# Patient Record
Sex: Female | Born: 1970 | Race: White | Hispanic: No | Marital: Married | Smoking: Never smoker
Health system: Southern US, Community
[De-identification: ages and names within clinical notes are randomized; demographics above are authoritative.]

## PROBLEM LIST (undated history)

## (undated) DIAGNOSIS — K644 Residual hemorrhoidal skin tags: Secondary | ICD-10-CM

## (undated) HISTORY — DX: Residual hemorrhoidal skin tags: K64.4

---
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
## 1992-04-14 HISTORY — PX: TONSILLECTOMY: SUR1361

## 2010-01-22 ENCOUNTER — Telehealth (INDEPENDENT_AMBULATORY_CARE_PROVIDER_SITE_OTHER): Payer: Self-pay | Admitting: *Deleted

## 2010-01-23 ENCOUNTER — Ambulatory Visit: Payer: Self-pay | Admitting: Sports Medicine

## 2010-01-23 DIAGNOSIS — M76899 Other specified enthesopathies of unspecified lower limb, excluding foot: Secondary | ICD-10-CM

## 2010-01-23 DIAGNOSIS — M629 Disorder of muscle, unspecified: Secondary | ICD-10-CM

## 2010-02-21 ENCOUNTER — Telehealth (INDEPENDENT_AMBULATORY_CARE_PROVIDER_SITE_OTHER): Payer: Self-pay | Admitting: *Deleted

## 2010-02-28 ENCOUNTER — Ambulatory Visit: Payer: Self-pay | Admitting: Sports Medicine

## 2010-02-28 DIAGNOSIS — M25559 Pain in unspecified hip: Secondary | ICD-10-CM

## 2010-05-14 NOTE — Progress Notes (Signed)
Summary: hip pain  Phone Note Call from Patient   Caller: Patient Summary of Call: Pt states she is training for 1/2 marathon in December and full marathon in Feb. She ran last tue 01/15/10 and felt a pinching sensation in her hip, by the end of that evening she was having trouble walking due to hip pain.  She did not run for a full week and the pain was better.  She ran 2 miles today, and the pain returned.  Advised patient not to run tomorrow, and come for her appt w/ Dr. Nedra Hai at 4pm for eval.  Initial call taken by: Rochele Pages RN,  January 22, 2010 12:17 PM

## 2010-05-14 NOTE — Assessment & Plan Note (Signed)
Summary: NP,HIP U/S,RUNNER,MC   Vital Signs:  Patient profile:   40 year old female Height:      63 inches Weight:      143 pounds BMI:     25.42 Pulse rate:   59 / minute BP sitting:   109 / 69  (right arm)  Vitals Entered By: Rochele Pages RN (January 23, 2010 4:04 PM) CC: lt hip pain x1 week   CC:  lt hip pain x1 week.  History of Present Illness: Patient presents to clinic for evaluation of left hip pain.  States she is training for a 1/2 marathon in December, running 20-25 miles per week.  Ran 01/15/10 and felt a pinch sensation in her left hip, by the end of that evening she was having trouble walking due to the pain in her hip.   She has taken meloxicam and aleve for the pain- this has been helpful. She did not run for the last week and the pain had improved.  She ran again yesterday and the hip pain returned, she also reports pain in lateral aspect of the left knee, she experienced for the first time yesterday. Just commited to a full marathon as well.  Was up to 10 miles on weekend and 5 miles x 2 days mid week before stopping running Icing since yesterday. No other prior problems with her hip. Has had prev PF and shin splints on L side as well. Has been to [employer] and gotten stability shoes for overpronation.  Allergies (verified): 1)  ! Sulfa  Physical Exam  General:  Well-developed,well-nourished,in no acute distress; alert,appropriate and cooperative throughout examination Msk:  Hips: FROM, + mild ttp over L GT.  Neg log roll and FADIR/FABER.  Nl strength with hip abd/IR/ER  Knees: FROM, mild ttp over LFC on L.  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 2)  Meloxicam 15 mg daily x 1 week, then as needed. 3)  Stretch two times a day 4)  1 week off from running, then gradually increase back starting at 1.5 or 2 miles, and increase 10-20% mileage per week until back to normal running distances. 5)  Do hip strength exercises at least 3 times per week. 6)  Cross train (bike or swim) this week and continue this on off days in the future. 7)  You have IT band syndrome and trochanteric bursitis.

## 2010-05-14 NOTE — Assessment & Plan Note (Signed)
Summary: F/U PER AMY,MC   CC:  hip pain.  History of Present Illness: 40yo female to office for f/u hip pain.  Started about 58-month ago. Has not been doing much running - doing about once a week at this point, has pain 1/2 mile in & really painful at 2-mile mark. Did 5k over weekend, ran 29-min.  Took aleve before race & did ok. 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 Occasional night time pain. Is doing stretches some, but not regularly. Is icing after runs & days following runs. Was running 20-25 miles/wk prior to injury. Leaving on cruise tomorrow to the [place].  Allergies: 1)  ! Sulfa  Review of Systems      See HPI  Physical Exam  General:  Well-developed,well-nourished,in no acute distress; alert,appropriate and cooperative throughout examination Msk:  HIPS: - L hip:  Full ROM, neg log roll.  TTP over greater troch, no groin tenderness.  Neg FABER/FADIR.  Slight weakness of hip flexion & adduction compared to right, othewise normal strength - R hip: full ROM, neg log roll.  No significant TTP over greater troch.  Neg FABER/FADIR.  Normal strength.  KNEES: full ROM b/l without pain.  Mild TTP b/l over insertion of ITB.  Midly positive Ober on left.  No effusion or swelling.  No ligamentous instability.  Neg mcmurray.  FEET: good longitudinal & transverse arches.  No signicant callous formation.  GAIT:  no leg length difference.  Slight external rotation of both feet with walking & running.  With running has mild outswing of R foot, but also has some mild knee wobble.  Neurovascularly intact distally   Impression & Recommendations:  Problem # 1:  HIP PAIN, LEFT (ICD-719.45)  - Secondary to greater troch bursititis.  Noted to have slight weakness of left hip compared to right.  Also with knee wobble with running.  Suspect running mechanics causing pain. - Steroid injection completed today into L greater  troch PROCEDURE NOTE Consent obtained and verified. Area cleansed with alcohol. Topical analgesic spray: Ethyl chloride. Joint: Left greater troch bursa Approached in typical fashion with: lateral approach Completed without difficulty Meds: 4cc Lidocaine 1% + 1cc Kenalog 40mg /cc (total vol= 5cc) Needle: 25g 1.5in Aftercare instructions and Red flags advised. Tolerated procedure well without difficulty. - Should rest for next 2-3 days.  May apply ice as needed for any discomfort/pain - Ok to start cross-training in 2-3 days. - Educated on hip exercises - should start these next week & do them daily.  Focus on lateral step-up, lateral cross-over step-ups, & lateral cross-over step-downs b/c has decent overall hip strength - Cont. meloxicam as prescribed - Ok to continue running as long as not limping - f/u 4-6 weeks if no improvement, otherwise as needed   Orders: Joint Aspirate / Injection, Intermediate (20605) Kenalog 10 mg inj (U9811)  Problem # 2:  TROCHANTERIC BURSITIS (ICD-726.5)  - Left GT bursitis - injected as stated above - plan as stated above  Orders: Joint Aspirate / Injection, Intermediate (91478) Kenalog 10 mg inj (J3301)  Problem # 3:  ILIOTIBIAL BAND SYNDROME, LEFT KNEE (ICD-728.89)  - Cont. ITB stretches - Cont. meloxicam - Ok to continue running as long as not limping - should stretch regularly during runs if ITB feeling tight - f/u 4-6 weeks if no improvement, otherwise prn  Orders: Joint Aspirate / Injection, Intermediate (29562) Kenalog 10 mg inj (Z3086)  Patient  Instructions: 1)  You have greater trochanteric bursitis & mild IT-band syndrome. 2)  We injected your left hip with a steroid today - this should help.  Rest the area for about 2-3 days, then start simple range of motion exercises. 3)  You may have some increased pain or stiffness in next 2-3 days, if that is the case you can apply some ice to area. 4)  Start doing hip exercises in about  1-week. 5)  Follow exercises on handout we gave you last time: 6)  - Lateral step-ups 7)  - Cross over step-ups 8)  - Cross over step-downs 9)  - Hip adduction & abduction 10)  - Hip flexion 11)  - Hip rotations 12)  - Hip extensions 13)  - all with 3-sets of 30 without weight 14)  - Do exercises at least once a day, but try twice a day.   15)  Continue to do IT band stretches as shown. 16)  Start cross training with stationary bike & elliptical. 17)  Ok to continue running as long as not limping. 18)  Follow-up 4-6 if persists, otherwise as needed.   Orders Added: 1)  Est. Patient Level III [16109] 2)  Joint Aspirate / Injection, Intermediate [20605] 3)  Kenalog 10 mg inj [J3301]

## 2010-05-14 NOTE — Progress Notes (Signed)
----   Converted from flag ---- ---- 02/21/2010 3:49 PM, Marily Memos wrote: Pt has questions regarding her appt coming up 12/1.  call back # [phone] ------------------------------  Returned patient's call.  She states she is still having significant hip pain with running.  States the pain starts about 1/2 mile into her runs, so she has not been running in the past few weeks.  She has iced her hip when it is painful, used anti inflammatories, and done stretches.  States she has not done her strengthening exercises as often as she should.  Advised the hip strengthening exercises are very important in helping to eventually relieve her pain. Pt interested in getting hip injection.  Advised she will need to come in for f/u appt to discuss that.  Scheduled pt for f/u 02/28/10.

## 2011-01-02 ENCOUNTER — Ambulatory Visit (INDEPENDENT_AMBULATORY_CARE_PROVIDER_SITE_OTHER): Payer: BC Managed Care – PPO | Admitting: Sports Medicine

## 2011-01-02 ENCOUNTER — Ambulatory Visit: Payer: Self-pay | Admitting: Sports Medicine

## 2011-01-02 VITALS — BP 100/60 | Ht 63.0 in | Wt 142.0 lb

## 2011-01-02 DIAGNOSIS — M25559 Pain in unspecified hip: Secondary | ICD-10-CM

## 2011-01-02 DIAGNOSIS — M76899 Other specified enthesopathies of unspecified lower limb, excluding foot: Secondary | ICD-10-CM

## 2011-01-02 DIAGNOSIS — M629 Disorder of muscle, unspecified: Secondary | ICD-10-CM

## 2011-01-02 MED ORDER — NITROGLYCERIN 0.2 MG/HR TD PT24: MEDICATED_PATCH | TRANSDERMAL | Status: DC

## 2011-01-02 NOTE — Assessment & Plan Note (Signed)
No sign of bursitis on today's ultrasound scan

## 2011-01-02 NOTE — Assessment & Plan Note (Addendum)
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

## 2011-01-02 NOTE — Assessment & Plan Note (Signed)
Iliotibial band continues to be affected but primarily based on its attachment to the tensor fascia lata

## 2011-01-02 NOTE — Progress Notes (Signed)
  Subjective:    Patient ID: Jacqueline Durham, female    DOB: 1971/04/02, 40 y.o.   MRN: [id number]  HPI  Pt presents to clinic for f/u of left hip pain which is now a little higher than greater trochanter.  This is painful with lying on that side, and occasionally with sitting.  Also has foot pain which she saw [facility]ropractor for- and he dx with PF.   She was first seen with this pain one year ago. She has been able to run off and on since that time but the pain never totally resolved. She had some active release therapy by Dr. Vear Clock and that did help but never resolved the pain.  She took the summer off and the pain was less well she was not running  Now as she has started back as she increased her mileage from 3 miles to 4 miles to 5 miles on her daily runs the left hip pain has returned at a slightly different position. She also developed some plantar fascial symptoms which are now pretty mild and did respond to some of the active release therapy   Review of Systems     Objective:   Physical Exam  Feet neutral in appearance Rear foot straight bilat Moderate long arch bilat No breakdown in transverse arch bilat  Leg lengths equal FABER tight on lt FABER less tight on rt Slight anterior rotation of lt pelvis Straight leg raise good bilat, slightly tight on lt Good abduction strength on lt Good rotation strength on lt  Running gait- turns right foot out Left side of pelvis is tilted forward farther than rt  Musculoskeletal ultrasound There is a spur about 2 cm below the superior insertion of the tensor fascia lata muscle on the left There is hypoechoic change in the tendon at this level There is also some increase in vascularity At the distal insertion into the superior area of the greater trochanter there is also spurring but with less hypoechoic change in the tendon  The greater trochanteric bursa does not appear swollen today         Assessment & Plan:

## 2011-01-02 NOTE — Patient Instructions (Signed)
Start using nitroglycerin patches.   Ok to run, but keep pain at 3 out of 10.  Do not run to a level that you are limping.   Ok to cross train on elliptical.   Please return for follow up in 6 weeks.

## 2011-02-20 ENCOUNTER — Encounter: Payer: Self-pay | Admitting: Sports Medicine

## 2011-02-20 ENCOUNTER — Ambulatory Visit (INDEPENDENT_AMBULATORY_CARE_PROVIDER_SITE_OTHER): Payer: BC Managed Care – PPO | Admitting: Sports Medicine

## 2011-02-20 VITALS — BP 107/71 | HR 60

## 2011-02-20 DIAGNOSIS — M76899 Other specified enthesopathies of unspecified lower limb, excluding foot: Secondary | ICD-10-CM

## 2011-02-20 NOTE — Progress Notes (Signed)
  Subjective:    Patient ID: Jacqueline Durham, female    DOB: [birth year], 40 y.o.   MRN: [id number]  HPI  Jacqueline Durham is coming today to F/U on her L hip trochanteric bursitis. She has been on the nitro patch for 6 weeks, she has been doing strengthening and stretching exercise on her own. She also worked with a Psychologist, educational for IT band. She is 80% better compare with last summer , she was able to run 4 mi two days ago without much discomfort.  Patient Active Problem List  Diagnoses  . HIP PAIN, LEFT  . TROCHANTERIC BURSITIS  . ILIOTIBIAL BAND SYNDROME, LEFT KNEE   Current Outpatient Prescriptions on File Prior to Visit  Medication Sig Dispense Refill  . meloxicam (MOBIC) 7.5 MG tablet Take 7.5 mg by mouth daily.        . nitroGLYCERIN (NITRODUR - DOSED IN MG/24 HR) 0.2 mg/hr Apply 1/4 patch to affected area daily.  Change patch every 24 hours.  30 patch  1   Allergies  Allergen Reactions  . Sulfonamide Derivatives        Review of Systems  Constitutional: Negative for fever, chills, diaphoresis and fatigue.  Musculoskeletal: Negative for back pain, joint swelling, arthralgias and gait problem.  Neurological: Negative for weakness and numbness.       Objective:   Physical Exam  Constitutional: He is oriented to person, place, and time. He appears well-developed and well-nourished.       BP 107/71  Pulse 60   Pulmonary/Chest: Effort normal.  Musculoskeletal:       Left hip with FROM . Mild TTP on the trochanteric bursa. No limping. Sensation intact distally  No pain with resisted abduction   Neurological: He is alert and oriented to person, place, and time.  Skin: Skin is warm. No rash noted. No erythema. No pallor.  Psychiatric: He has a normal mood and affect.          Assessment & Plan:   1. TROCHANTERIC BURSITIS   Continue nitro patch.Ok to increase her dose to 1/2 patch gradually Continue IT band stretches and strengthening exercise. Gradually increase running  distance. F/U in 6 weeks. Ice massage after running

## 2011-02-20 NOTE — Patient Instructions (Signed)
Continue nitro patch.Ok to increase her dose to 1/2 patch gradually Continue IT band stretches and strengthening exercise. Gradually increase running distance. F/U in 6 weeks. Ice massage after running

## 2011-04-01 ENCOUNTER — Ambulatory Visit (INDEPENDENT_AMBULATORY_CARE_PROVIDER_SITE_OTHER): Payer: BC Managed Care – PPO | Admitting: Sports Medicine

## 2011-04-01 ENCOUNTER — Encounter: Payer: Self-pay | Admitting: Sports Medicine

## 2011-04-01 VITALS — BP 112/64 | HR 67

## 2011-04-01 DIAGNOSIS — M629 Disorder of muscle, unspecified: Secondary | ICD-10-CM

## 2011-04-01 DIAGNOSIS — M76899 Other specified enthesopathies of unspecified lower limb, excluding foot: Secondary | ICD-10-CM

## 2011-04-01 DIAGNOSIS — M25559 Pain in unspecified hip: Secondary | ICD-10-CM

## 2011-04-01 NOTE — Assessment & Plan Note (Signed)
Continue with stretches and exercises as prescribed

## 2011-04-01 NOTE — Assessment & Plan Note (Signed)
Continue using the nitroglycerin as she has clearly had some improvement since starting this

## 2011-04-01 NOTE — Progress Notes (Signed)
  Subjective:    Patient ID: Jacqueline Durham, female    DOB: 09/02/1970, 40 y.o.   MRN: [id number]  HPI  Pt presents to clinic for f/u of lt hip pain which she states is about the same. She has increased to 1/2 patch of nitroglycerin without problems. Had been running 4 miles- 3 times per week for 1 month. Last week tried to increase to 5 miles had hip pain during this run, then stood in line for 3 hours that evening, had increased hip pain for 2 days after this.   This was the second episode of increased hip pain since last visit that lasted for 2 days after running.  Has not run in 1 week due to hip pain.   Taking meloxicam on days that she runs, and aleve if she still has pain.  Had gotten up to 15 miles of running during 1 week before pain worsened. 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   Review of Systems     Objective:   Physical Exam  NAD  30 deg IR, 45 deg ER bilat hips Good hip flexion strength bilat Hip abduction strength good bilat Rotation strength good Hip rotation good on lt  MSK ultrasound There is a small avulsion spur at the insertion of the tensor fascial to the iliac crest Today there is some hypoechoic change around this tendon  At the greater trochanter there is a small spur and some calcification in the tendon sheath of what is probably the piriformis No hypoechoic change seen No abnormal Doppler activity      Assessment & Plan:

## 2011-04-01 NOTE — Patient Instructions (Signed)
I will recheck you in about 3 mos unless you run into greater probs  Keep at 1/2 NTG patch unless probs  Keep up hip exercises at least 3 x per wk  Stretch daily  Running - keep level at 4 miles until 1 wk pain free No increase > 1/2 mile at a time  Have a good holidy

## 2011-04-01 NOTE — Assessment & Plan Note (Signed)
Bursa shows calcification but no true inflammation today

## 2011-04-21 ENCOUNTER — Telehealth: Payer: Self-pay | Admitting: *Deleted

## 2011-04-21 NOTE — Telephone Encounter (Signed)
Pt called stating her hip still hurts when she stands up, works as a Runner, broadcasting/film/video.  She is scheduled to do the [employer] marathon this Sunday, she is planning to walk the marathon until she has pain and then stop.  She is concerned that she will do more damage if she attempts the marathon.  Also she wants to know if Dr. Darrick Penna would do the needling procedure he discussed with her at the last visit to break up her bone spurs.  She is still using NTG patches daily.  Advised will ask Dr. Darrick Penna and call her back.

## 2011-04-22 NOTE — Telephone Encounter (Signed)
Per Dr. Darrick Penna- advised pt he does not think she will do any permanent damage to her hip by walking the marathon, but cannot garuantee that she will not have pain.    Also he advised that he would do the needling procedure after the marathon.  Pt scheduled an appt for this.

## 2011-05-12 ENCOUNTER — Ambulatory Visit (INDEPENDENT_AMBULATORY_CARE_PROVIDER_SITE_OTHER): Payer: BC Managed Care – PPO | Admitting: Sports Medicine

## 2011-05-12 VITALS — BP 116/72

## 2011-05-12 DIAGNOSIS — M25559 Pain in unspecified hip: Secondary | ICD-10-CM

## 2011-05-12 NOTE — Assessment & Plan Note (Addendum)
Her pain is improving.  Since she has made it through the marathon and is doing better she can do a period of relative rest to allow healing to occur at the area where she had the small avulsion.  She will continue with the NTG since this is helping.  We will see her back in 6 weeks.

## 2011-05-12 NOTE — Progress Notes (Signed)
  Subjective:    Patient ID: Jacqueline Durham, female    DOB: Feb 25, 1971, 41 y.o.   MRN: [id number]  HPI Jacqueline Durham is here to follow up on left hip pain.  She had two areas of pain, one at the greater trochanter and one at the iliac crest.  She has been using NTG.  Her pain at the greater trochanter is resolved.  She was able to complete the [facility] without difficulty.  Her pain was aggravated by prolonged standing but that is improved.  It is also aggravated by lateral rotation of the trunk to the right or extreme flexion of the hip.  She is doing well on her NTG.  She used KT taping during the run.  She did a 7 hour walk/run.   Review of Systems     Objective:   Physical Exam  Hip: Pain is reproduced with palpation of the anterior iliac crest with the hip internally rotated. Normal range of motion Normal strength of the adductors, abductors, and flexors No tenderness of the ITB No tenderness of the greater trochanter        Assessment & Plan:

## 2011-05-12 NOTE — Patient Instructions (Addendum)
1. Keep doing your hip exercises so that you can keep up your strength.  2. You need 6 weeks of relative rest.  Keep your running mileage low and stick to other low impact activities.  3. Continue using your nitroglycerin patches.  4. Follow up with [place] in 6 weeks.

## 2011-05-14 ENCOUNTER — Other Ambulatory Visit: Payer: Self-pay | Admitting: *Deleted

## 2011-05-14 MED ORDER — NITROGLYCERIN 0.2 MG/HR TD PT24: MEDICATED_PATCH | TRANSDERMAL | Status: AC

## 2011-05-28 ENCOUNTER — Ambulatory Visit (INDEPENDENT_AMBULATORY_CARE_PROVIDER_SITE_OTHER): Payer: BC Managed Care – PPO | Admitting: Surgery

## 2011-05-28 ENCOUNTER — Encounter (INDEPENDENT_AMBULATORY_CARE_PROVIDER_SITE_OTHER): Payer: Self-pay | Admitting: Surgery

## 2011-05-28 VITALS — BP 104/68 | HR 66 | Temp 97.6°F | Resp 16 | Ht 63.0 in | Wt 147.2 lb

## 2011-05-28 DIAGNOSIS — K644 Residual hemorrhoidal skin tags: Secondary | ICD-10-CM | POA: Insufficient documentation

## 2011-05-28 HISTORY — DX: Residual hemorrhoidal skin tags: K64.4

## 2011-05-28 MED ORDER — HYDROCORTISONE ACE-PRAMOXINE 2.5-1 % RE CREA: TOPICAL_CREAM | Freq: Four times a day (QID) | RECTAL | Status: AC

## 2011-05-28 NOTE — Progress Notes (Signed)
Subjective:     Patient ID: Jacqueline Durham, female   DOB: 04-16-1970, 41 y.o.   MRN: [id number]  HPI  Jacqueline Durham  1971-03-16 [id number]  Patient Care Team: Lovenia Kim as PCP - General (Physician Assistant)  This patient is a 41 y.o.female who presents today for surgical evaluation at the request of Mr. Tommi Emery.   Reason for visit: Annoying external hemorrhoids.  The patient is a healthy female who has struggled with hemorrhoidal problems. She has had irritable bowel diagnosed in the past. She had a normal colonoscopy in the distant past in [place]. It was stress related. She is switched her job and that is much better. She takes FiberCon 2 tablets a day. She has one soft bowel movement a day.  She still has an external anal skin tags/hemorrhoids. They are often irritating. It is hard to keep clean. No major bleeding or prolapse that she can recall. She mentioned this to her primary care . He recommended considering if something more aggressive needs to be done with surgery.  Otherwise healthy and active. No prior anorectal treatments. Had an appendectomy in the distant past  Patient Active Problem List  Diagnoses  . HIP PAIN, LEFT  . TROCHANTERIC BURSITIS  . ILIOTIBIAL BAND SYNDROME, LEFT KNEE  . External hemorrhoids with itching    Past Medical History  Diagnosis Date  . Hemorrhoids   . External hemorrhoids with itching 05/28/2011    Past Surgical History  Procedure Date  . Appendectomy 1984  . Tonsillectomy 1994    History   Social History  . Marital Status: Married    Spouse Name: N/A    Number of Children: N/A  . Years of Education: N/A   Occupational History  . Not on file.   Social History Main Topics  . Smoking status: Never Smoker   . Smokeless tobacco: Never Used  . Alcohol Use: Yes     socially   . Drug Use: No  . Sexually Active: Not on file   Other Topics Concern  . Not on file   Social History Narrative  . No narrative on file     Family History  Problem Relation Age of Onset  . Emphysema Father   . Cancer Maternal Grandmother     patient unsure of type    Current outpatient prescriptions:Ascorbic Acid (VITAMIN C PO), Take by mouth daily., Disp: , Rfl: ;  desvenlafaxine (PRISTIQ) 50 MG 24 hr tablet, Take 50 mg by mouth daily. Patient taking 1/2 dose. Per history form dated 05/28/11., Disp: , Rfl: ;  Omega-3 Fatty Acids (FISH OIL) 500 MG CAPS, Take by mouth daily., Disp: , Rfl: ;  polycarbophil (FIBERCON) 625 MG tablet, Take 625 mg by mouth 2 (two) times daily., Disp: , Rfl:  VITAMIN E PO, Take by mouth daily., Disp: , Rfl: ;  meloxicam (MOBIC) 7.5 MG tablet, Take 7.5 mg by mouth daily.  , Disp: , Rfl: ;  nitroGLYCERIN (NITRODUR - DOSED IN MG/24 HR) 0.2 mg/hr, Apply 1/2 patch to affected area daily.  Change patch every 24 hours., Disp: 30 patch, Rfl: 1  Allergies  Allergen Reactions  . Sulfonamide Derivatives Rash    All over the body.    BP 104/68  Pulse 66  Temp(Src) 97.6 F (36.4 C) (Temporal)  Resp 16  Ht 5\' 3"  (1.6 m)  Wt 147 lb 3.2 oz (66.769 kg)  BMI 26.08 kg/m2     Review of Systems  Constitutional: Negative for fever, chills, diaphoresis, appetite  change and fatigue.  HENT: Negative for ear pain, sore throat, trouble swallowing, neck pain and ear discharge.   Eyes: Negative for photophobia, discharge and visual disturbance.  Respiratory: Negative for cough, choking, chest tightness and shortness of breath.   Cardiovascular: Negative for chest pain and palpitations.  Gastrointestinal: Negative for nausea, vomiting, abdominal pain, diarrhea, constipation, anal bleeding and rectal pain.       No personal nor family history of GI/colon cancer, inflammatory bowel disease, irritable bowel syndrome, allergy such as Celiac Sprue, dietary/dairy problems, colitis, ulcers nor gastritis.    No recent sick contacts/gastroenteritis.  No travel outside the country.  No changes in diet.    Genitourinary:  Negative for dysuria, frequency and difficulty urinating.  Musculoskeletal: Negative for myalgias and gait problem.  Skin: Negative for color change, pallor and rash.  Neurological: Negative for dizziness, speech difficulty, weakness and numbness.  Hematological: Negative for adenopathy.  Psychiatric/Behavioral: Negative for suicidal ideas, confusion, sleep disturbance, self-injury and agitation. The patient is nervous/anxious.        Objective:   Physical Exam  Constitutional: She is oriented to person, place, and time. She appears well-developed and well-nourished. No distress.  HENT:  Head: Normocephalic.  Mouth/Throat: Oropharynx is clear and moist. No oropharyngeal exudate.  Eyes: Conjunctivae and EOM are normal. Pupils are equal, round, and reactive to light. No scleral icterus.  Neck: Normal range of motion. Neck supple. No tracheal deviation present.  Cardiovascular: Normal rate, regular rhythm and intact distal pulses.   Pulmonary/Chest: Effort normal and breath sounds normal. No respiratory distress. She exhibits no tenderness.  Abdominal: Soft. She exhibits no distension and no mass. There is no tenderness. Hernia confirmed negative in the right inguinal area and confirmed negative in the left inguinal area.  Genitourinary: Vagina normal. No vaginal discharge found.       Perianal skin clean with good hygiene.  No pruritis.  No pilonidal disease.  No fissure.  No abscess/fistula.    Tolerates digital and anoscopic rectal exam.  Normal sphincter tone.  No rectal masses.    Excess perianal skin anterior especially with mild irritation.  Int hemorrhoidal piles mild/mod inflamed but no ulceration/prolapse   Musculoskeletal: Normal range of motion. She exhibits no tenderness.  Lymphadenopathy:    She has no cervical adenopathy.       Right: No inguinal adenopathy present.       Left: No inguinal adenopathy present.  Neurological: She is alert and oriented to person, place, and  time. No cranial nerve deficit. She exhibits normal muscle tone. Coordination normal.  Skin: Skin is warm and dry. No rash noted. She is not diaphoretic. No erythema.  Psychiatric: She has a normal mood and affect. Her behavior is normal. Judgment and thought content normal.       Assessment:     Hemorrhoids with irritation despite good bowel regimen & hygeine    Plan:     At this point, I did offer to try and trim the excess external hemorrhoid tissue if that is the main problem. I would do this in the operating room since too sensitive to try her in the office.. I think it may be better to do a THD hemorrhoidal ligation and pexy since her internal piles are mildly inflamed. There may allow all the tissue to tuck in and avoid any excess trimming. However, she may need some.  I did offer to give advice for sticking to nonoperative management, and she is doing virtually all this and  still having ongoing symptoms.   They are too low & sensitive to inject or band. She initially was holding off but now seems interested in considering surgery.  The anatomy & physiology of the anorectal region was discussed.  The pathophysiology of hemorrhoids and differential diagnosis was discussed.  Natural history risks without surgery was discussed.   I stressed the importance of a bowel regimen to have daily soft bowel movements to minimize progression of disease.  Interventions such as sclerotherapy & banding were discussed.  The patient's symptoms are not adequately controlled by medicines and other non-operative treatments.  I feel the risks & problems of no surgery outweigh the operative risks; therefore, I recommended surgery to treat the hemorrhoids by ligation, pexy, and possible resection.  Risks such as bleeding, infection, need for further treatment, heart attack, death, and other risks were discussed.   I noted a good likelihood this will help address the problem.  Goals of post-operative recovery were  discussed as well.  Possibility that this will not correct all symptoms was explained.  Post-operative pain, bleeding, constipation, and other problems after surgery were discussed.  We will work to minimize complications.   Educational handouts further explaining the pathology, treatment options, and bowel regimen were given as well.  Questions were answered.  The patient expresses understanding & wishes to proceed with surgery.  Use wet wipes daily.  Analpram PRN - Rx - e-prescribed

## 2011-05-28 NOTE — Progress Notes (Deleted)
Subjective:     Patient ID: Jacqueline Durham, female   DOB: [birth year], 41 y.o.   MRN: [id number]  HPI  [name]  [birth year] [id number]  Patient Care Team: Lovenia Kim as PCP - General (Physician Assistant)  This patient is a 41 y.o.female who presents today for surgical evaluation at the request of Lovenia Kim.     Patient Active Problem List  Diagnoses  . HIP PAIN, LEFT  . TROCHANTERIC BURSITIS  . ILIOTIBIAL BAND SYNDROME, LEFT KNEE    No past medical history on file.  No past surgical history on file.  History   Social History  . Marital Status: Married    Spouse Name: N/A    Number of Children: N/A  . Years of Education: N/A   Occupational History  . Not on file.   Social History Main Topics  . Smoking status: Never Smoker   . Smokeless tobacco: Never Used  . Alcohol Use: Yes     socially   . Drug Use: No  . Sexually Active: Not on file   Other Topics Concern  . Not on file   Social History Narrative  . No narrative on file    No family history on file.  Current outpatient prescriptions:Ascorbic Acid (VITAMIN C PO), Take by mouth daily., Disp: , Rfl: ;  desvenlafaxine (PRISTIQ) 50 MG 24 hr tablet, Take 50 mg by mouth daily. Patient taking 1/2 dose. Per history form dated 05/28/11., Disp: , Rfl: ;  Omega-3 Fatty Acids (FISH OIL) 500 MG CAPS, Take by mouth daily., Disp: , Rfl: ;  polycarbophil (FIBERCON) 625 MG tablet, Take 625 mg by mouth 2 (two) times daily., Disp: , Rfl:  VITAMIN E PO, Take by mouth daily., Disp: , Rfl: ;  meloxicam (MOBIC) 7.5 MG tablet, Take 7.5 mg by mouth daily.  , Disp: , Rfl: ;  nitroGLYCERIN (NITRODUR - DOSED IN MG/24 HR) 0.2 mg/hr, Apply 1/2 patch to affected area daily.  Change patch every 24 hours., Disp: 30 patch, Rfl: 1  Allergies  Allergen Reactions  . Sulfonamide Derivatives Rash    All over the body.    BP 104/68  Pulse 66  Temp(Src) 97.6 F (36.4 C) (Temporal)  Resp 16  Ht 5\' 3"  (1.6 m)  Wt 147 lb 3.2 oz (66.769 kg)   BMI 26.08 kg/m2     Review of Systems  Constitutional: Negative for fever, chills and diaphoresis.  HENT: Negative for nosebleeds, sore throat, facial swelling, mouth sores, trouble swallowing and ear discharge.   Eyes: Negative for photophobia, discharge and visual disturbance.  Respiratory: Negative for choking, chest tightness, shortness of breath and stridor.   Cardiovascular: Negative for chest pain and palpitations.  Gastrointestinal: Negative for nausea, vomiting, abdominal pain, diarrhea, constipation, blood in stool, abdominal distention, anal bleeding and rectal pain.  Genitourinary: Negative for dysuria, urgency, difficulty urinating and testicular pain.  Musculoskeletal: Negative for myalgias, back pain, arthralgias and gait problem.  Skin: Negative for color change, pallor, rash and wound.  Neurological: Negative for dizziness, speech difficulty, weakness, numbness and headaches.  Hematological: Negative for adenopathy. Does not bruise/bleed easily.  Psychiatric/Behavioral: Negative for hallucinations, confusion and agitation.       Objective:   Physical Exam  Constitutional: He is oriented to person, place, and time. He appears well-developed and well-nourished. No distress.  HENT:  Head: Normocephalic.  Mouth/Throat: Oropharynx is clear and moist. No oropharyngeal exudate.  Eyes: Conjunctivae and EOM are normal. Pupils are equal, round, and reactive to  light. No scleral icterus.  Neck: Normal range of motion. Neck supple. No tracheal deviation present.  Cardiovascular: Normal rate, regular rhythm and intact distal pulses.   Pulmonary/Chest: Effort normal and breath sounds normal. No respiratory distress.  Abdominal: Soft. He exhibits no distension. There is no tenderness. Hernia confirmed negative in the right inguinal area and confirmed negative in the left inguinal area.  Musculoskeletal: Normal range of motion. He exhibits no tenderness.  Lymphadenopathy:    He has  no cervical adenopathy.       Right: No inguinal adenopathy present.       Left: No inguinal adenopathy present.  Neurological: He is alert and oriented to person, place, and time. No cranial nerve deficit. He exhibits normal muscle tone. Coordination normal.  Skin: Skin is warm and dry. No rash noted. He is not diaphoretic. No erythema. No pallor.  Psychiatric: He has a normal mood and affect. His behavior is normal. Judgment and thought content normal.       Inquisitive.  Pleasant       Assessment:     ***    Plan:     The anatomy & physiology of the anorectal region was discussed.  The pathophysiology of hemorrhoids and differential diagnosis was discussed.  Natural history progression  was discussed.   I stressed the importance of a bowel regimen to have daily soft bowel movements to minimize progression of disease.   Use of wet wipes, warm baths, avoiding straining, etc were emphasized.  Educational handouts further explaining the pathology, treatment options, and bowel regimen were given as well.   The patient expressed understanding.

## 2011-06-23 ENCOUNTER — Ambulatory Visit (INDEPENDENT_AMBULATORY_CARE_PROVIDER_SITE_OTHER): Payer: BC Managed Care – PPO | Admitting: Sports Medicine

## 2011-06-23 DIAGNOSIS — M25559 Pain in unspecified hip: Secondary | ICD-10-CM

## 2011-06-23 NOTE — Assessment & Plan Note (Signed)
This is much improved  I think she can wean NTG over next 10 days w a qod regimen  Keep up some of the exercises 3x week  Add some general strength work for running and for core  Reck w me prn

## 2011-06-23 NOTE — Patient Instructions (Addendum)
Runner's lunge holding dumbbells   Drop squats- raise up on heels slightly then drop into a half squat  Heel raises on step with knee straight and knee bent  Chair sit against wall x 30 seconds  Start using Nitroglycerin patch every other day for the next 10 days if your pain level does not increase ok to discontinue nitroglycerin patches   Please follow up as needed  Thank you for seeing [place] today!

## 2011-06-23 NOTE — Progress Notes (Signed)
  Subjective:    Patient ID: Jacqueline Durham, female    DOB: 1971-03-08, 41 y.o.   MRN: [id number]  HPI  Pt presents to clinic for f/u of lt hip pain which she states is greatly improved. 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  She has continued using NTG 1/4 patch per day. Has been seeing [facility] for lt ITB pain on RT knee This has improved as well  Running up to 3 miles w no pain Now at ~ 9 mpw   Review of Systems     Objective:   Physical Exam NAD ROM bilat hips - 60 deg seated Lt hip flexion 150 deg, rt hip 140 deg FABER normal bilat Hip abduction and rotation strong bilat Hip flexion strong bilat  nonTTP over left hip  RT Knee: Normal to inspection with no erythema or effusion or obvious bony abnormalities. Palpation normal with no warmth or joint line tenderness or patellar tenderness or condyle tenderness. ROM normal in flexion and extension and lower leg rotation. Ligaments with solid consistent endpoints including ACL, PCL, LCL, MCL. Negative Mcmurray's and provocative meniscal tests. Non painful patellar compression. Patellar and quadriceps tendons unremarkable. Hamstring and quadriceps strength is normal.       Assessment & Plan:

## 2011-07-03 ENCOUNTER — Other Ambulatory Visit: Payer: Self-pay | Admitting: *Deleted

## 2011-07-03 DIAGNOSIS — M629 Disorder of muscle, unspecified: Secondary | ICD-10-CM

## 2015-01-25 ENCOUNTER — Ambulatory Visit (INDEPENDENT_AMBULATORY_CARE_PROVIDER_SITE_OTHER): Payer: 59 | Admitting: Sports Medicine

## 2015-01-25 ENCOUNTER — Encounter: Payer: Self-pay | Admitting: Sports Medicine

## 2015-01-25 VITALS — BP 123/73 | HR 90 | Ht 63.0 in | Wt 153.0 lb

## 2015-01-25 DIAGNOSIS — M79662 Pain in left lower leg: Secondary | ICD-10-CM | POA: Diagnosis not present

## 2015-01-25 NOTE — Patient Instructions (Signed)
Patient Instructions  You have inflammation of the fascial lining that attaches to the bone from the muscle (Shin-splits) - you may continue to run with the following modifications: -- Week 1: 10 min run max with 1min run/1 min walk intervals  -- Weeks 2-3 may increase run length by 5 mins each week (15 mins, 20 mins, 25 mins); maintain 1 min walk/761min run intervals  -- Week 4: 30 min run max with 4 min run/1 min walk  --If no issues with running at increase the length of time running and the length of each interval.  -- 1-2x/week stationary biking or elliptical machine to increase strength of quad muscles  -- Wear compression sleeves over calf while running  - Perform rehab exercises: Drop squats, Runner's Lunge, and walking exercises

## 2015-01-25 NOTE — Progress Notes (Signed)
  HPI: Jacqueline Durham is a 44 y.o female who presents to clinic with reoccurring left tib/fib pain.  She reports that it has been present for the past 1 year and that it comes and goes.  She reports that the last incident occurred 2 months ago.  She states that she notices the pain the most with running at has a diffuse pain located across her anterior tibia and medially in to the muscle.  She reports that she has not tried in medications or icing but has massages and active release therapy which helps some.  Since the pain has started this time around she has ceased running.  She also reports occasional swelling and "bumps" medial to the tibia.  She denies any weakness of her lower extremities.  She also reports that she does not use any orthotics while running.   Review of Systems  All other systems reviewed and are negative. denies leg swelling or redness/  Denies joint swelling  Past Hx:  Sober and recovering/  Exercise helps with this process/  Taking antirdepressant meds  Physical Exam: BP 123/73 mmHg  Pulse 90  Ht 5\' 3"  (1.6 m)  Wt 153 lb (69.4 kg)  BMI 27.11 kg/m2 Physical Exam  Constitutional: She appears well-developed and well-nourished. No distress.  Musculoskeletal:       Left lower leg: She exhibits bony tenderness. She exhibits no tenderness, no swelling and no edema.  Neurological: She has normal strength. She exhibits normal muscle tone. Gait normal.  Psychiatric: She has a normal mood and affect.  Knee: Full ROM  Palpation tib/fib: ttp directly over center of anterior tibia, non-ttp over gastrocnemius muscle bellies Sensation: intact  Gait: normal / she can run and hop without limp  01/25/2015 Limited U/S of left tibia/fibula - No signs of tibial stress fracture  Assessment/Plan  Jacqueline Durham is an otherwise healthy 44 y.o. presenting with tib/fib pain and physical exam findings consistent with inflammation of the fascia. - Modified running as follows: -- Week 1:  10 min run max with 1min run/1 min walk intervals  -- Weeks 2-3: May increase run length by 5 mins each week (15 mins, 20 mins, 25 mins); maintain 1 min walk/791min run intervals  -- Week 4: 30 min run max with 4 min run/1 min walk  --If no issues with running at increase the length of time running and the length of each interval.  -- 1-2x/week stationary biking or elliptical machine to increase strength of quad muscles  - Wear compression sleeves over calf while running  - Perform rehab exercises: Drop squats, Runner's Lunge, and walking exercises  We provides sports insoles with additonal arch padding on left/  More comfortable running in these

## 2015-01-26 DIAGNOSIS — M79662 Pain in left lower leg: Secondary | ICD-10-CM | POA: Insufficient documentation

## 2015-01-26 NOTE — Assessment & Plan Note (Signed)
See plan  HEP  Sports insoles  Compression Ice  If not better further eval and poss orthotics

## 2017-02-02 ENCOUNTER — Other Ambulatory Visit: Payer: Self-pay | Admitting: Internal Medicine

## 2017-02-02 DIAGNOSIS — R1084 Generalized abdominal pain: Secondary | ICD-10-CM

## 2017-02-03 ENCOUNTER — Ambulatory Visit (INDEPENDENT_AMBULATORY_CARE_PROVIDER_SITE_OTHER): Payer: 59

## 2017-02-03 DIAGNOSIS — K760 Fatty (change of) liver, not elsewhere classified: Secondary | ICD-10-CM | POA: Diagnosis not present

## 2017-02-03 DIAGNOSIS — R1084 Generalized abdominal pain: Secondary | ICD-10-CM

## 2017-02-03 MED ORDER — IOPAMIDOL (ISOVUE-300) INJECTION 61%
100.0000 mL | Freq: Once | INTRAVENOUS | Status: AC | PRN
  Administered 2017-02-03: 100 mL via INTRAVENOUS

## 2017-05-01 ENCOUNTER — Other Ambulatory Visit: Payer: Self-pay | Admitting: Orthopedic Surgery

## 2017-05-01 DIAGNOSIS — M7501 Adhesive capsulitis of right shoulder: Secondary | ICD-10-CM

## 2017-05-01 DIAGNOSIS — M25511 Pain in right shoulder: Secondary | ICD-10-CM

## 2017-05-02 ENCOUNTER — Ambulatory Visit
Admission: RE | Admit: 2017-05-02 | Discharge: 2017-05-02 | Disposition: A | Discharge status: Home / Self Care | Payer: 59 | Source: Ambulatory Visit | Attending: Orthopedic Surgery | Admitting: Orthopedic Surgery

## 2017-05-02 DIAGNOSIS — M7501 Adhesive capsulitis of right shoulder: Secondary | ICD-10-CM

## 2017-05-02 DIAGNOSIS — M25511 Pain in right shoulder: Secondary | ICD-10-CM

## 2017-06-23 ENCOUNTER — Ambulatory Visit (INDEPENDENT_AMBULATORY_CARE_PROVIDER_SITE_OTHER): Payer: 59 | Admitting: Podiatry

## 2017-06-23 ENCOUNTER — Encounter: Payer: Self-pay | Admitting: Podiatry

## 2017-06-23 DIAGNOSIS — B351 Tinea unguium: Secondary | ICD-10-CM

## 2017-06-23 DIAGNOSIS — L600 Ingrowing nail: Secondary | ICD-10-CM | POA: Diagnosis not present

## 2017-06-23 NOTE — Patient Instructions (Addendum)
You can look at getting a medication called "fungi-nail" at the store to put on your nails  Onychomycosis/Fungal Toenails  WHAT IS IT? An infection that lies within the keratin of your nail plate that is caused by a fungus.  WHY ME? Fungal infections affect all ages, sexes, races, and creeds.  There may be many factors that predispose you to a fungal infection such as age, coexisting medical conditions such as diabetes, or an autoimmune disease; stress, medications, fatigue, genetics, etc.  Bottom line: fungus thrives in a warm, moist environment and your shoes offer such a location.  IS IT CONTAGIOUS? Theoretically, yes.  You do not want to share shoes, nail clippers or files with someone who has fungal toenails.  Walking around barefoot in the same room or sleeping in the same bed is unlikely to transfer the organism.  It is important to realize, however, that fungus can spread easily from one nail to the next on the same foot.  HOW DO WE TREAT THIS?  There are several ways to treat this condition.  Treatment may depend on many factors such as age, medications, pregnancy, liver and kidney conditions, etc.  It is best to ask your doctor which options are available to you.  1. No treatment.   Unlike many other medical concerns, you can live with this condition.  However for many people this can be a painful condition and may lead to ingrown toenails or a bacterial infection.  It is recommended that you keep the nails cut short to help reduce the amount of fungal nail. 2. Topical treatment.  These range from herbal remedies to prescription strength nail lacquers.  About 40-50% effective, topicals require twice daily application for approximately 9 to 12 months or until an entirely new nail has grown out.  The most effective topicals are medical grade medications available through physicians offices. 3. Oral antifungal medications.  With an 80-90% cure rate, the most common oral medication requires 3 to  4 months of therapy and stays in your system for a year as the new nail grows out.  Oral antifungal medications do require blood work to make sure it is a safe drug for you.  A liver function panel will be performed prior to starting the medication and after the first month of treatment.  It is important to have the blood work performed to avoid any harmful side effects.  In general, this medication safe but blood work is required. 4. Laser Therapy.  This treatment is performed by applying a specialized laser to the affected nail plate.  This therapy is noninvasive, fast, and non-painful.  It is not covered by insurance and is therefore, out of pocket.  The results have been very good with a 80-95% cure rate.  The [facility] is the only practice in the area to offer this therapy. 5. Permanent Nail Avulsion.  Removing the entire nail so that a new nail will not grow back.

## 2017-06-28 NOTE — Progress Notes (Signed)
Subjective:   Patient ID: Jacqueline Durham, female   DOB: 47 y.o.   MRN: [id number]   HPI Darl PikesSusan presents the office today for concerns of ingrown toenails to both of her big toes.  She states they are much better today than they used to be.  She states that she had to paronychias.  She states that this may have started up again pedicures.  She is been using floss underneath her toenails to help get the ingrown toenail and this is been helping quite a bit.  She has minimal pain to the ingrown today but she wants to have the area checked.  Currently denies any drainage or pus or any swelling.  She has no other concerns today.   Review of Systems  All other systems reviewed and are negative.       Objective:  Physical Exam  General: AAO x3, NAD  Dermatological: Incurvation present on both the medial lateral aspects of bilateral hallux toenails but there is minimal tenderness palpation this is only towards the distal portion of the nail.  There is no edema, erythema, drainage or pus or any clinical signs of infection noted today.  Overall in the nails do have a somewhat discolored appearance with yellow-brown discoloration and spotting within the toenail.  This is present all nails.  There is no open lesions or pre-ulcerative lesions identified today.  Vascular: Dorsalis Pedis artery and Posterior Tibial artery pedal pulses are 2/4 bilateral with immedate capillary fill time.  There is no pain with calf compression, swelling, warmth, erythema.   Neruologic: Grossly intact via light touch bilateral.  Protective threshold with Semmes Wienstein monofilament intact to all pedal sites bilateral.   Musculoskeletal: No gross boney pedal deformities bilateral. No pain, crepitus, or limitation noted with foot and ankle range of motion bilateral. Muscular strength 5/5 in all groups tested bilateral.  Gait: Unassisted, Nonantalgic.       Assessment:   Ingrown toenails bilateral hallux, onychomycosis     Plan:  -Treatment options discussed including all alternatives, risks, and complications -Etiology of symptoms were discussed -We discussed partial nail avulsion but she is leaving to go to [place] in the next week.  Because of this we will hold off on doing a partial nail avulsions.  I did sharply debride the nails without any complications or bleeding to patient comfort.  Also given the nail discoloration we discussed multiple treatment options for this.  She is in start with topical treatment.  We discussed an over-the-counter option as she is going to do.  We discussed the success rates in the time of stent take to get the nails to grow out more clear.  She was upset with this today but there is nothing that can be done to get rid of the nail fungus immediately.  Also discussed toenail polish and discussed the different use topical treatment the medication is not able to penetrate the nail polish she was upset because she wants more nail polish as well.  Vivi BarrackMatthew R Wagoner DPM   Vivi BarrackMatthew R Wagoner DPM

## 2018-11-21 IMAGING — MR MR SHOULDER*R* W/O CM
4 of 5 series · 27 of 40 positions shown · non-contrast
Comparison: None.

CLINICAL DATA: 46-year-old with anterior shoulder pain and limited
range of motion for 1 year. Shoulder impingement and adhesive
capsulitis. No previous relevant surgery.

EXAM:
MRI OF THE RIGHT SHOULDER WITHOUT CONTRAST
TECHNIQUE: Multiplanar, multisequence MR imaging of the shoulder was performed.
No intravenous contrast was administered.

[Series 3: T2 fat-sat · axial · 4.0mm · 0.55mm/px · z∈[-26,+75]mm · 8 of 23 slices shown (1 of 3)]
[im 1/23]
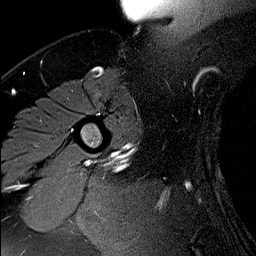
[im 3/23]
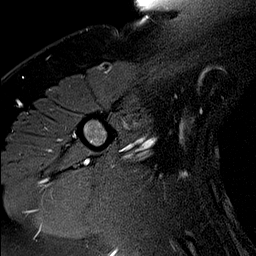
[im 8/23]
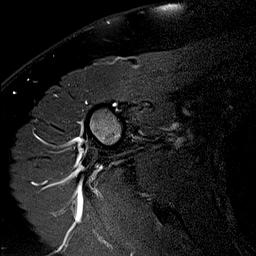
[im 10/23]
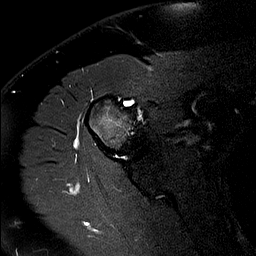
[im 13/23]
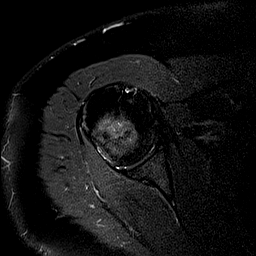
[im 15/23]
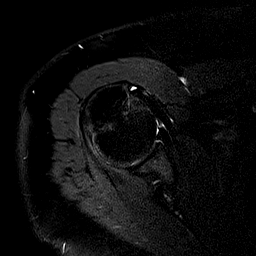
[im 20/23]
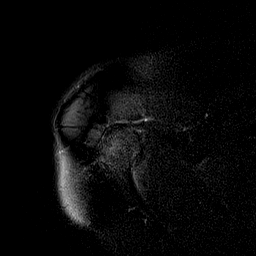
[im 23/23]
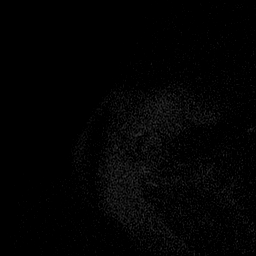

[Series 4: T2 fat-sat · coronal · 4.0mm · 0.55mm/px · 8 of 20 slices shown (2 of 3)]
[im 1/20]
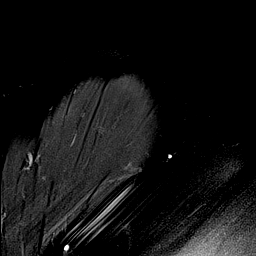
[im 3/20]
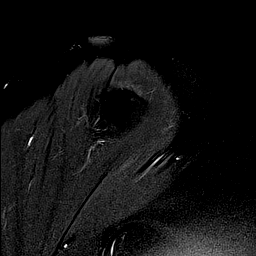
[im 6/20]
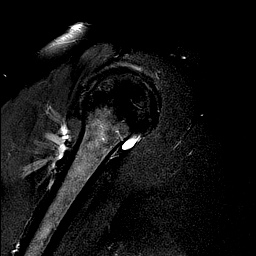
[im 9/20]
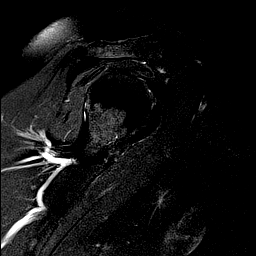
[im 11/20]
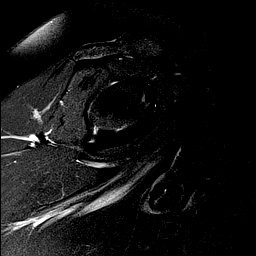
[im 14/20]
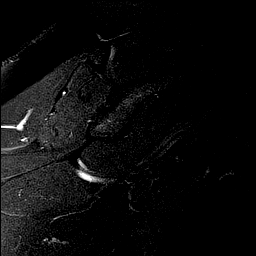
[im 17/20]
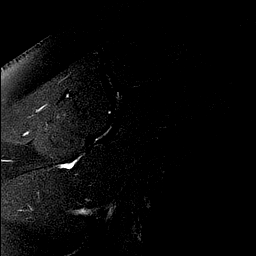
[im 20/20]
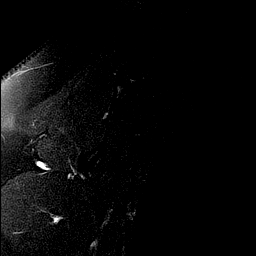

[Series 6: T2 fat-sat · sagittal · 4.0mm · 0.27mm/px · 4 of 17 slices shown (3 of 3)]
[im 1/17]
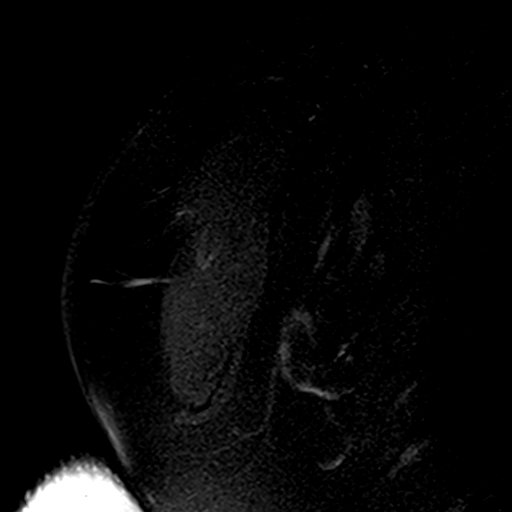
[im 3/17]
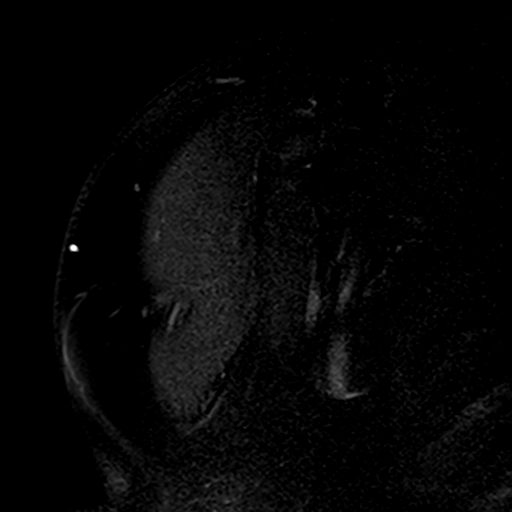
[im 9/17]
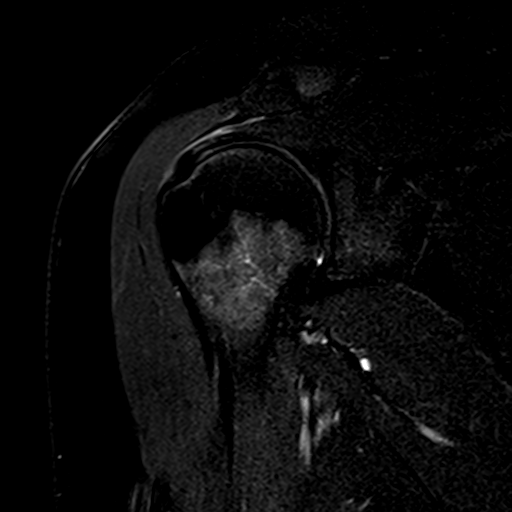
[im 14/17]
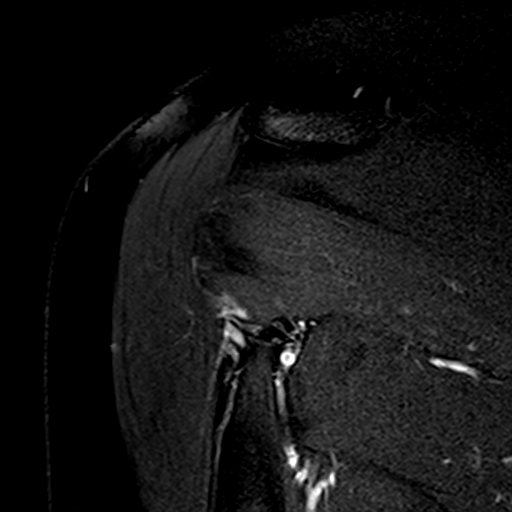

[Series 7: PD · sagittal · 4.0mm · 0.22mm/px · 7 of 17 slices shown]
[im 1/17]
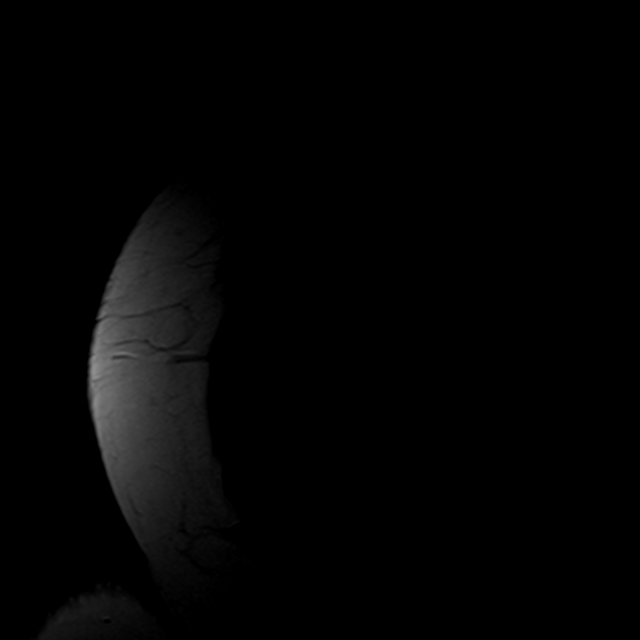
[im 3/17]
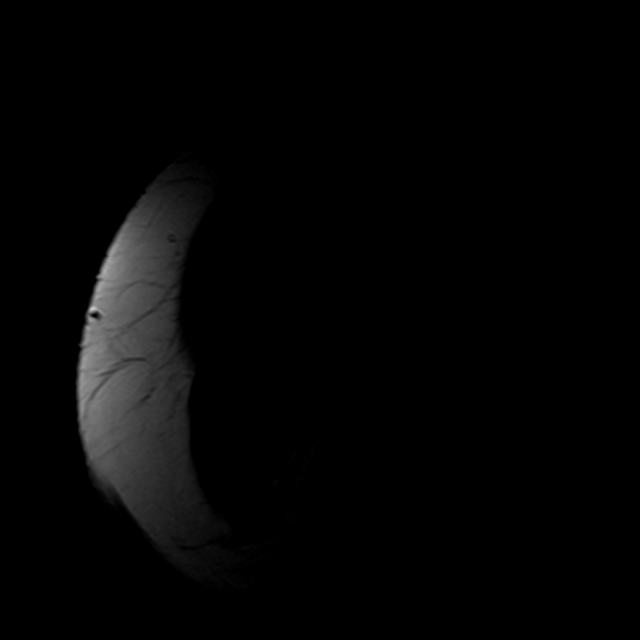
[im 6/17]
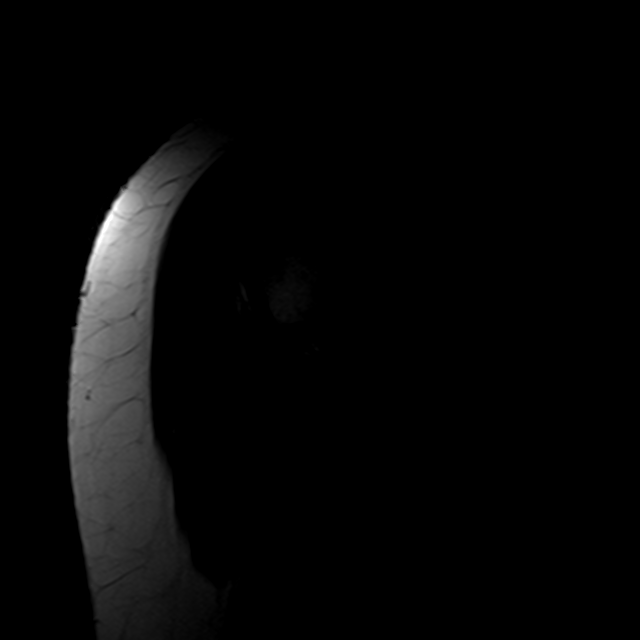
[im 9/17]
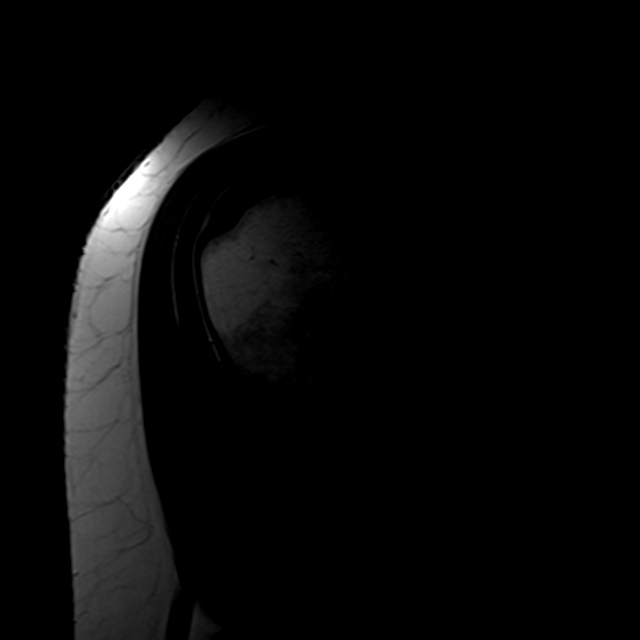
[im 11/17]
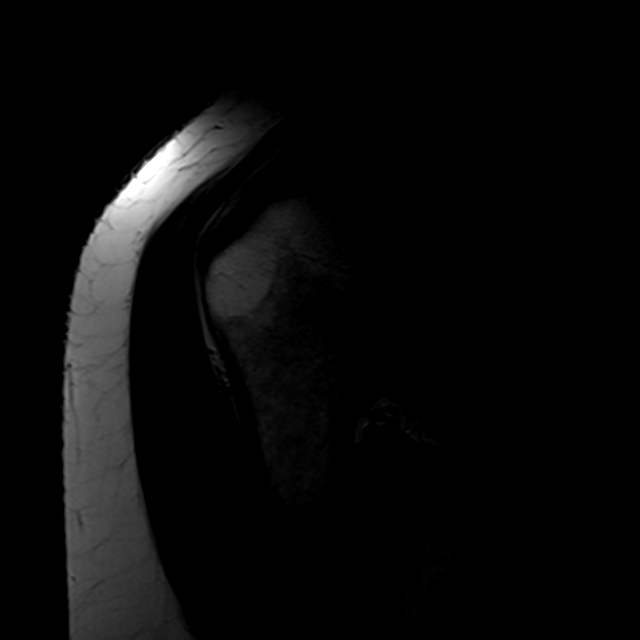
[im 14/17]
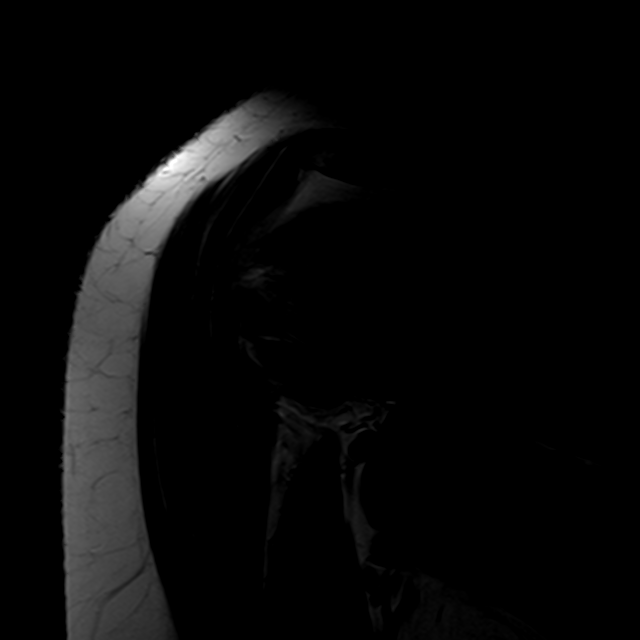
[im 17/17]
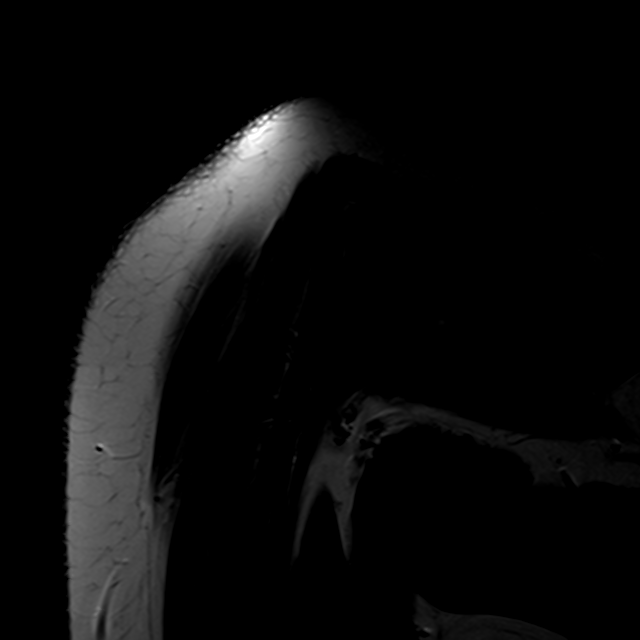

[27 of 40 positions shown; findings below may reference images not displayed]

FINDINGS: Rotator cuff: Intact without significant tendinosis. There is focal
globular low T2 signal along the bursal surface of the distal
supraspinatus tendon (best seen on sagittal image 17), suspicious
for possible hydroxyapatite deposition.

Muscles:  No focal muscular atrophy or edema.

Biceps long head:  Intact and normally positioned.

Acromioclavicular Joint: The acromion is type 1. There are mild
acromioclavicular degenerative changes. There is only trace fluid in
the subacromial-subdeltoid bursa.

Glenohumeral Joint: No significant shoulder joint effusion or
glenohumeral arthropathy. No appreciable joint capsular thickening
or edema.

Labrum: Labral evaluation limited by the lack of joint fluid. No
evidence of labral tear or paralabral cyst.

Bones: No acute or significant extra-articular osseous findings.

Other: No significant soft tissue findings.
IMPRESSION: 1. Focal globular T2 signal along the bursal surface of the distal
supraspinatus tendon, suggesting calcific tendinitis/bursitis. Plain
film correlation recommended. Minimal fluid in the
subacromial-subdeltoid bursa.
2. No evidence of rotator cuff tear or significant impingement.
3. The biceps tendon and labrum appear intact.

## 2022-03-27 ENCOUNTER — Other Ambulatory Visit (HOSPITAL_BASED_OUTPATIENT_CLINIC_OR_DEPARTMENT_OTHER): Payer: Self-pay

## 2022-03-27 MED ORDER — METHYLPHENIDATE HCL ER (OSM) 36 MG PO TBCR
36.0000 mg | EXTENDED_RELEASE_TABLET | Freq: Every morning | ORAL | 0 refills | Status: AC
  Filled 2022-03-27 (×2): qty 30, 30d supply, fill #0

## 2022-04-29 ENCOUNTER — Other Ambulatory Visit: Payer: Self-pay
